# Patient Record
Sex: Male | Born: 1990 | Race: White | Hispanic: No | Marital: Single | Smoking: Current every day smoker
Health system: Southern US, Community
[De-identification: ages and names within clinical notes are randomized; demographics above are authoritative.]

## PROBLEM LIST (undated history)

## (undated) DIAGNOSIS — K5792 Diverticulitis of intestine, part unspecified, without perforation or abscess without bleeding: Secondary | ICD-10-CM

## (undated) DIAGNOSIS — F319 Bipolar disorder, unspecified: Secondary | ICD-10-CM

## (undated) DIAGNOSIS — F329 Major depressive disorder, single episode, unspecified: Secondary | ICD-10-CM

## (undated) DIAGNOSIS — F32A Depression, unspecified: Secondary | ICD-10-CM

---
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
## 2010-04-09 LAB — COMPREHENSIVE METABOLIC PANEL
ALT: 53 U/L (ref 0–53)
AST: 31 U/L (ref 0–37)
Albumin: 4.4 g/dL (ref 3.5–5.2)
Alkaline Phosphatase: 96 U/L (ref 39–117)
BUN: 8 mg/dL (ref 6–23)
CO2: 28 mEq/L (ref 19–32)
Calcium: 9.3 mg/dL (ref 8.4–10.5)
Chloride: 104 mEq/L (ref 96–112)
Creatinine, Ser: 1.09 mg/dL (ref 0.4–1.5)
GFR calc Af Amer: >60 mL/min (ref >60)
GFR calc non Af Amer: >60 mL/min (ref >60)
Glucose, Bld: 93 mg/dL (ref 70–99)
Potassium: 3.4 mEq/L — ABNORMAL LOW (ref 3.5–5.1)
Sodium: 138 mEq/L (ref 135–145)
Total Bilirubin: 0.5 mg/dL (ref 0.3–1.2)
Total Protein: 7.8 g/dL (ref 6.0–8.3)

## 2010-04-09 LAB — CBC
HCT: 45.3 % (ref 39.0–52.0)
Hemoglobin: 15.7 g/dL (ref 13.0–17.0)
MCHC: 34.7 g/dL (ref 30.0–36.0)
MCV: 84.7 fL (ref 78.0–100.0)
Platelets: 290 10*3/uL (ref 150–400)
RBC: 5.35 MIL/uL (ref 4.22–5.81)
RDW: 12.9 % (ref 11.5–15.5)
WBC: 11.8 10*3/uL — ABNORMAL HIGH (ref 4.0–10.5)

## 2010-04-09 LAB — DIFFERENTIAL
Eosinophils Absolute: 0.1 10*3/uL (ref 0.0–0.7)
Lymphocytes Relative: 24 % (ref 12–46)
Lymphs Abs: 2.8 10*3/uL (ref 0.7–4.0)
Monocytes Relative: 6 % (ref 3–12)
Neutrophils Relative %: 69 % (ref 43–77)

## 2010-04-09 LAB — URINALYSIS, ROUTINE W REFLEX MICROSCOPIC
Bilirubin Urine: NEGATIVE
Hgb urine dipstick: NEGATIVE
Ketones, ur: NEGATIVE mg/dL
Nitrite: NEGATIVE
Protein, ur: NEGATIVE mg/dL
Urobilinogen, UA: 0.2 mg/dL (ref 0.0–1.0)

## 2010-04-09 LAB — RAPID URINE DRUG SCREEN, HOSP PERFORMED
Amphetamines: POSITIVE — AB
Barbiturates: NOT DETECTED
Benzodiazepines: NOT DETECTED
Cocaine: NOT DETECTED
Opiates: NOT DETECTED
Tetrahydrocannabinol: NOT DETECTED

## 2010-04-09 LAB — ETHANOL: Alcohol, Ethyl (B): <5 mg/dL (ref 0–10)
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
## 2011-05-08 ENCOUNTER — Encounter (HOSPITAL_COMMUNITY): Payer: Self-pay | Admitting: *Deleted

## 2011-05-08 ENCOUNTER — Emergency Department (HOSPITAL_COMMUNITY)
Admission: EM | Admit: 2011-05-08 | Discharge: 2011-05-08 | Disposition: A | Discharge status: Home / Self Care | Payer: Medicaid Other | Attending: Emergency Medicine | Admitting: Emergency Medicine

## 2011-05-08 DIAGNOSIS — R059 Cough, unspecified: Secondary | ICD-10-CM | POA: Insufficient documentation

## 2011-05-08 DIAGNOSIS — R05 Cough: Secondary | ICD-10-CM | POA: Insufficient documentation

## 2011-05-08 DIAGNOSIS — R5381 Other malaise: Secondary | ICD-10-CM | POA: Insufficient documentation

## 2011-05-08 DIAGNOSIS — R110 Nausea: Secondary | ICD-10-CM | POA: Insufficient documentation

## 2011-05-08 DIAGNOSIS — J029 Acute pharyngitis, unspecified: Secondary | ICD-10-CM

## 2011-05-08 DIAGNOSIS — F319 Bipolar disorder, unspecified: Secondary | ICD-10-CM | POA: Insufficient documentation

## 2011-05-08 DIAGNOSIS — F17200 Nicotine dependence, unspecified, uncomplicated: Secondary | ICD-10-CM | POA: Insufficient documentation

## 2011-05-08 HISTORY — DX: Major depressive disorder, single episode, unspecified: F32.9

## 2011-05-08 HISTORY — DX: Depression, unspecified: F32.A

## 2011-05-08 HISTORY — DX: Bipolar disorder, unspecified: F31.9

## 2011-05-08 MED ORDER — ONDANSETRON 4 MG PO TBDP
4.0000 mg | ORAL_TABLET | Freq: Once | ORAL | Status: AC
  Administered 2011-05-08: 4 mg via ORAL
  Filled 2011-05-08: qty 1

## 2011-05-08 MED ORDER — IBUPROFEN 200 MG PO TABS
400.0000 mg | ORAL_TABLET | Freq: Once | ORAL | Status: AC
  Administered 2011-05-08: 400 mg via ORAL
  Filled 2011-05-08: qty 1

## 2011-05-08 MED ORDER — DEXAMETHASONE SODIUM PHOSPHATE 10 MG/ML IJ SOLN
10.0000 mg | Freq: Once | INTRAMUSCULAR | Status: DC
  Filled 2011-05-08: qty 1

## 2011-05-08 MED ORDER — PROMETHAZINE HCL 25 MG PO TABS: 25.0000 mg | ORAL_TABLET | Freq: Three times a day (TID) | ORAL | Status: DC | PRN
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
## 2011-05-08 NOTE — ED Provider Notes (Signed)
History    20yM with sore throat. Intermittent for past couple months. No fever or chills. Coughing. Sometimes productive. Smoker. Feels very fatigued. Has lost almost 50 lbs in the past year but took up skateboarding during this time and is generally much more active. No difficulty breathing. No vomiting. No urinary complaints. Denies PMHx aside from depression.   CSN: [id number]  Arrival date & time 05/08/11  1135   First MD Initiated Contact with Patient 05/08/11 1319      Chief Complaint  Patient presents with  . Sore Throat    (Consider location/radiation/quality/duration/timing/severity/associated sxs/prior treatment) HPI  Past Medical History  Diagnosis Date  . Depression   . Bipolar disorder     History reviewed. No pertinent past surgical history.  No family history on file.  History  Substance Use Topics  . Smoking status: Current Everyday Smoker -- 1.0 packs/day  . Smokeless tobacco: Not on file  . Alcohol Use: Yes     ocassionally      Review of Systems   Review of symptoms negative unless otherwise noted in HPI.   Allergies  Review of patient's allergies indicates no known allergies.  Home Medications   Current Outpatient Rx  Name Route Sig Dispense Refill  . PROMETHAZINE HCL 25 MG PO TABS Oral Take 1 tablet (25 mg total) by mouth every 8 (eight) hours as needed for nausea. 30 tablet 0    BP 125/78  Pulse 47  Temp(Src) 97.9 F (36.6 C) (Oral)  Resp 12  Wt 202 lb 8 oz (91.853 kg)  SpO2 99%  Physical Exam  Nursing note and vitals reviewed. Constitutional: He appears well-developed and well-nourished. No distress.  HENT:  Head: Normocephalic and atraumatic.  Right Ear: External ear normal.  Left Ear: External ear normal.  Mouth/Throat: Oropharynx is clear and moist.       Pharyngitis without exudate. Uvula is midline. Patient is handling secretions. No tongue elevation. Submental tissues are soft. There is no cervical adenopathy. Neck is  supple.  Eyes: Conjunctivae are normal. Right eye exhibits no discharge. Left eye exhibits no discharge.  Neck: Neck supple.  Cardiovascular: Normal rate, regular rhythm and normal heart sounds.  Exam reveals no gallop and no friction rub.   No murmur heard. Pulmonary/Chest: Effort normal and breath sounds normal. No respiratory distress.  Abdominal: Soft. He exhibits no distension. There is no tenderness.  Musculoskeletal: He exhibits no edema and no tenderness.  Neurological: He is alert.  Skin: Skin is warm and dry.  Psychiatric: He has a normal mood and affect. His behavior is normal. Thought content normal.    ED Course  Procedures (including critical care time)  Labs Reviewed - No data to display No results found.   1. Pharyngitis   2. Nausea       MDM  20yM with multiple vague and somewhat chronic complaints. Very low suspicion for emergent etiology. Abdominal exam benign. Possible viral pharyngitis. No evidence of deep space infection. Dose decadron for symptomatic relief. Phenergan for nausea. Benign abdominal exam. Recent weight loss but explainable with increased activity. Doubt malignancy. Pt needs to establish primary care. Resources provided.        Raeford Razor, MD 05/08/11 [id number]

## 2011-05-08 NOTE — Discharge Instructions (Signed)
Antibiotic Nonuse  Your caregiver felt that the infection or problem was not one that would be helped with an antibiotic. Infections may be caused by viruses or bacteria. Only a caregiver can tell which one of these is the likely cause of an illness. A cold is the most common cause of infection in both adults and children. A cold is a virus. Antibiotic treatment will have no effect on a viral infection. Viruses can lead to many lost days of work caring for sick children and many missed days of school. Children may catch as many as 10 "colds" or "flus" per year during which they can be tearful, cranky, and uncomfortable. The goal of treating a virus is aimed at keeping the ill person comfortable. Antibiotics are medications used to help the body fight bacterial infections. There are relatively few types of bacteria that cause infections but there are hundreds of viruses. While both viruses and bacteria cause infection they are very different types of germs. A viral infection will typically go away by itself within 7 to 10 days. Bacterial infections may spread or get worse without antibiotic treatment. Examples of bacterial infections are:  Sore throats (like strep throat or tonsillitis).   Infection in the lung (pneumonia).   Ear and skin infections.  Examples of viral infections are:  Colds or flus.   Most coughs and bronchitis.   Sore throats not caused by Strep.   Runny noses.  It is often best not to take an antibiotic when a viral infection is the cause of the problem. Antibiotics can kill off the helpful bacteria that we have inside our body and allow harmful bacteria to start growing. Antibiotics can cause side effects such as allergies, nausea, and diarrhea without helping to improve the symptoms of the viral infection. Additionally, repeated uses of antibiotics can cause bacteria inside of our body to become resistant. That resistance can be passed onto harmful bacterial. The next time  you have an infection it may be harder to treat if antibiotics are used when they are not needed. Not treating with antibiotics allows our own immune system to develop and take care of infections more efficiently. Also, antibiotics will work better for [place] when they are prescribed for bacterial infections. Treatments for a child that is ill may include:  Give extra fluids throughout the day to stay hydrated.   Get plenty of rest.   Only give your child over-the-counter or prescription medicines for pain, discomfort, or fever as directed by your caregiver.   The use of a cool mist humidifier may help stuffy noses.   Cold medications if suggested by your caregiver.  Your caregiver may decide to start you on an antibiotic if:  The problem you were seen for today continues for a longer length of time than expected.   You develop a secondary bacterial infection.  SEEK MEDICAL CARE IF:  Fever lasts longer than 5 days.   Symptoms continue to get worse after 5 to 7 days or become severe.   Difficulty in breathing develops.   Signs of dehydration develop (poor drinking, rare urinating, dark colored urine).   Changes in behavior or worsening tiredness (listlessness or lethargy).  Document Released: 03/19/2001 Document Revised: 12/28/2010 Document Reviewed: 09/15/2008 [facility] Patient Information 2012 [place], [place].Nausea, Adult Nausea is the feeling that you have an upset stomach or have to vomit. Nausea by itself is not likely a serious concern, but it may be an early sign of more serious medical problems. As  nausea gets worse, it can lead to vomiting. If vomiting develops, there is the risk of dehydration.  CAUSES   Viral infections.   Food poisoning.   Medicines.   Pregnancy.   Motion sickness.   Migraine headaches.   Emotional distress.   Severe pain from any source.   Alcohol intoxication.  HOME CARE INSTRUCTIONS  Get plenty of rest.   Ask your caregiver about  specific rehydration instructions.   Eat small amounts of food and sip liquids more often.   Take all medicines as told by your caregiver.  SEEK MEDICAL CARE IF:  You have not improved after 2 days, or you get worse.   You have a headache.  SEEK IMMEDIATE MEDICAL CARE IF:   You have a fever.   You faint.   You keep vomiting or have blood in your vomit.   You are extremely weak or dehydrated.   You have dark or bloody stools.   You have severe chest or abdominal pain.  MAKE SURE YOU:  Understand these instructions.   Will watch your condition.   Will get help right away if you are not doing well or get worse.  Document Released: 02/16/2004 Document Revised: 12/28/2010 Document Reviewed: 09/20/2010 [facility] Patient Information 2012 [place], [place].Viral Pharyngitis Viral pharyngitis is a viral infection that produces redness, pain, and swelling (inflammation) of the throat. It can spread from person to person (contagious). CAUSES Viral pharyngitis is caused by inhaling a large amount of certain germs called viruses. Many different viruses cause viral pharyngitis. SYMPTOMS Symptoms of viral pharyngitis include:  Sore throat.   Tiredness.   Stuffy nose.   Low-grade fever.   Congestion.   Cough.  TREATMENT Treatment includes rest, drinking plenty of fluids, and the use of over-the-counter medication (approved by your caregiver). HOME CARE INSTRUCTIONS   Drink enough fluids to keep your urine clear or pale yellow.   Eat soft, cold foods such as ice cream, frozen ice pops, or gelatin dessert.   Gargle with warm salt water (1 tsp salt per 1 qt of water).   If over age 29, throat lozenges may be used safely.   Only take over-the-counter or prescription medicines for pain, discomfort, or fever as directed by your caregiver. Do not take aspirin.  To help prevent spreading viral pharyngitis to others, avoid:  Mouth-to-mouth contact with others.   Sharing utensils  for eating and drinking.   Coughing around others.  SEEK MEDICAL CARE IF:   You are better in a few days, then become worse.   You have a fever or pain not helped by pain medicines.   There are any other changes that concern you.  Document Released: 10/18/2004 Document Revised: 12/28/2010 Document Reviewed: 03/16/2010 [facility] Patient Information 2012 [place], [place].  RESOURCE GUIDE  Dental Problems  Patients with Medicaid: [facility] [phone]                                           [phone] [address] [employer] Phone:  [phone]  Phone:  [phone]  If unable to pay or uninsured, contact:  [facility] or [facility]. to become qualified for the adult dental clinic.  Chronic Pain Problems Contact [facility]  [phone] Patients need to be referred by their primary care doctor.  Insufficient Money for Medicine Contact [employer]:  call "211" or [facility] [phone].  No Primary Care Doctor Call [facility]  [phone] Other agencies that provide inexpensive medical care    [facility]  [phone]    [facility]  [phone]    [facility]  [phone]    [facility]  [phone]    [facility]  [phone]    [facility]  [phone]  Psychological Services [facility]  [phone] [facility]  [phone] [facility]   [phone] (emergency services [phone])  Substance Abuse Resources [facility]  [phone] [facility] [phone] [facility] [place] [phone] Floydene Flock [phone] Residential & Outpatient Substance Abuse Program  [phone]  Abuse/Neglect [facility] Child Abuse Hotline [phone] [facility] Child Abuse Hotline [phone] (After Hours)  Emergency  Shelter [facility] [phone]  [facility] [place] [facility] [phone] [facility] [phone]  MRSA Hotline #:   (971)459-2599    [facility] Resources  [facility]     [employer]                          [facility] Dept. [address] [place]                       [address]      [address]  Blondell Reveal Phone:  [phone]                                   Phone:  [phone]                 Phone:  [phone]  [facility] Phone:  [phone]  [facility] Child Abuse Hotline [phone] [phone] (After Hours)

## 2013-06-11 ENCOUNTER — Emergency Department (HOSPITAL_COMMUNITY): Payer: Medicaid Other

## 2013-06-11 ENCOUNTER — Encounter (HOSPITAL_COMMUNITY): Payer: Self-pay | Admitting: Emergency Medicine

## 2013-06-11 ENCOUNTER — Emergency Department (HOSPITAL_COMMUNITY)
Admission: EM | Admit: 2013-06-11 | Discharge: 2013-06-11 | Disposition: A | Discharge status: Home / Self Care | Payer: Medicaid Other | Attending: Emergency Medicine | Admitting: Emergency Medicine

## 2013-06-11 DIAGNOSIS — Z8659 Personal history of other mental and behavioral disorders: Secondary | ICD-10-CM | POA: Insufficient documentation

## 2013-06-11 DIAGNOSIS — N201 Calculus of ureter: Secondary | ICD-10-CM | POA: Insufficient documentation

## 2013-06-11 DIAGNOSIS — F17200 Nicotine dependence, unspecified, uncomplicated: Secondary | ICD-10-CM | POA: Insufficient documentation

## 2013-06-11 DIAGNOSIS — N23 Unspecified renal colic: Secondary | ICD-10-CM

## 2013-06-11 LAB — COMPREHENSIVE METABOLIC PANEL
ALK PHOS: 72 U/L (ref 39–117)
ALT: 17 U/L (ref 0–53)
AST: 21 U/L (ref 0–37)
Albumin: 4.4 g/dL (ref 3.5–5.2)
BILIRUBIN TOTAL: 0.3 mg/dL (ref 0.3–1.2)
BUN: 14 mg/dL (ref 6–23)
CHLORIDE: 101 meq/L (ref 96–112)
CO2: 25 meq/L (ref 19–32)
CREATININE: 1.01 mg/dL (ref 0.50–1.35)
Calcium: 9.3 mg/dL (ref 8.4–10.5)
GFR calc Af Amer: >90 mL/min (ref >90)
Glucose, Bld: 119 mg/dL — ABNORMAL HIGH (ref 70–99)
Potassium: 3.4 mEq/L — ABNORMAL LOW (ref 3.7–5.3)
Sodium: 141 mEq/L (ref 137–147)
Total Protein: 7.6 g/dL (ref 6.0–8.3)

## 2013-06-11 LAB — CBC WITH DIFFERENTIAL/PLATELET
BASOS ABS: 0 10*3/uL (ref 0.0–0.1)
Basophils Relative: 0 % (ref 0–1)
Eosinophils Absolute: 0.1 10*3/uL (ref 0.0–0.7)
Eosinophils Relative: 1 % (ref 0–5)
HEMATOCRIT: 41.4 % (ref 39.0–52.0)
HEMOGLOBIN: 14.8 g/dL (ref 13.0–17.0)
LYMPHS PCT: 19 % (ref 12–46)
Lymphs Abs: 3.2 10*3/uL (ref 0.7–4.0)
MCH: 28.7 pg (ref 26.0–34.0)
MCHC: 35.7 g/dL (ref 30.0–36.0)
MCV: 80.4 fL (ref 78.0–100.0)
MONO ABS: 1.3 10*3/uL — AB (ref 0.1–1.0)
MONOS PCT: 8 % (ref 3–12)
NEUTROS ABS: 11.9 10*3/uL — AB (ref 1.7–7.7)
Neutrophils Relative %: 72 % (ref 43–77)
Platelets: 279 10*3/uL (ref 150–400)
RBC: 5.15 MIL/uL (ref 4.22–5.81)
RDW: 12.3 % (ref 11.5–15.5)
WBC: 16.5 10*3/uL — AB (ref 4.0–10.5)

## 2013-06-11 LAB — URINALYSIS, ROUTINE W REFLEX MICROSCOPIC
BILIRUBIN URINE: NEGATIVE
GLUCOSE, UA: NEGATIVE mg/dL
KETONES UR: NEGATIVE mg/dL
LEUKOCYTES UA: NEGATIVE
Nitrite: NEGATIVE
PROTEIN: NEGATIVE mg/dL
Specific Gravity, Urine: 1.021 (ref 1.005–1.030)
Urobilinogen, UA: 0.2 mg/dL (ref 0.0–1.0)
pH: 7 (ref 5.0–8.0)

## 2013-06-11 LAB — URINE MICROSCOPIC-ADD ON

## 2013-06-11 LAB — LIPASE, BLOOD: Lipase: 35 U/L (ref 11–59)

## 2013-06-11 MED ORDER — ONDANSETRON 8 MG PO TBDP: 8.0000 mg | ORAL_TABLET | Freq: Three times a day (TID) | ORAL | Status: DC | PRN

## 2013-06-11 MED ORDER — IOHEXOL 300 MG/ML  SOLN
100.0000 mL | Freq: Once | INTRAMUSCULAR | Status: AC | PRN
  Administered 2013-06-11: 100 mL via INTRAVENOUS

## 2013-06-11 MED ORDER — IOHEXOL 300 MG/ML  SOLN
50.0000 mL | Freq: Once | INTRAMUSCULAR | Status: AC | PRN
  Administered 2013-06-11: 50 mL via ORAL

## 2013-06-11 MED ORDER — HYDROMORPHONE HCL PF 1 MG/ML IJ SOLN
1.0000 mg | Freq: Once | INTRAMUSCULAR | Status: AC
  Administered 2013-06-11: 1 mg via INTRAVENOUS
  Filled 2013-06-11: qty 1

## 2013-06-11 MED ORDER — CEPHALEXIN 500 MG PO CAPS: 500.0000 mg | ORAL_CAPSULE | Freq: Four times a day (QID) | ORAL | Status: AC

## 2013-06-11 MED ORDER — SODIUM CHLORIDE 0.9 % IV BOLUS (SEPSIS)
1000.0000 mL | Freq: Once | INTRAVENOUS | Status: AC
  Administered 2013-06-11: 1000 mL via INTRAVENOUS

## 2013-06-11 MED ORDER — KETOROLAC TROMETHAMINE 30 MG/ML IJ SOLN
30.0000 mg | Freq: Once | INTRAMUSCULAR | Status: AC
  Administered 2013-06-11: 30 mg via INTRAVENOUS
  Filled 2013-06-11: qty 1

## 2013-06-11 MED ORDER — OXYCODONE-ACETAMINOPHEN 5-325 MG PO TABS: 1.0000 | ORAL_TABLET | ORAL | Status: DC | PRN

## 2013-06-11 MED ORDER — ONDANSETRON HCL 4 MG/2ML IJ SOLN
4.0000 mg | Freq: Once | INTRAMUSCULAR | Status: AC
  Administered 2013-06-11: 4 mg via INTRAVENOUS
  Filled 2013-06-11: qty 2

## 2013-06-11 NOTE — ED Notes (Signed)
Patient transported to CT 

## 2013-06-11 NOTE — ED Notes (Signed)
Pt reports bilateral lower abd pain that woke pt from sleep at 7am. Pt states he threw up multiple times this am, no diarrhea.

## 2013-06-11 NOTE — ED Provider Notes (Signed)
CSN: [id number]     Arrival date & time 06/11/13  1018 History   First MD Initiated Contact with Patient 06/11/13 1024     Chief Complaint  Patient presents with  . Abdominal Pain  . Emesis     (Consider location/radiation/quality/duration/timing/severity/associated sxs/prior Treatment) Patient is a 23 y.o. male presenting with abdominal pain and vomiting. The history is provided by the patient.  Abdominal Pain Pain location:  Generalized Pain quality: aching   Pain severity:  Severe Onset quality:  Unable to specify Timing:  Constant Chronicity:  New Context: awakening from sleep   Context: not alcohol use, not diet changes, not eating, not laxative use, not medication withdrawal, not previous surgeries, not recent illness, not recent sexual activity, not recent travel, not retching, not sick contacts, not suspicious food intake and not trauma   Relieved by:  Nothing Worsened by:  Nothing tried Ineffective treatments:  None tried Associated symptoms: vomiting   Risk factors: no alcohol abuse and has not had multiple surgeries   Emesis Associated symptoms: abdominal pain     Past Medical History  Diagnosis Date  . Depression   . Bipolar disorder    History reviewed. No pertinent past surgical history. History reviewed. No pertinent family history. History  Substance Use Topics  . Smoking status: Current Every Day Smoker -- 1.00 packs/day  . Smokeless tobacco: Not on file  . Alcohol Use: Yes     Comment: ocassionally    Review of Systems  Gastrointestinal: Positive for vomiting and abdominal pain.  All other systems reviewed and are negative.     Allergies  Review of patient's allergies indicates no known allergies.  Home Medications   Prior to Admission medications   Medication Sig Start Date End Date Taking? Authorizing Provider  promethazine (PHENERGAN) 25 MG tablet Take 1 tablet (25 mg total) by mouth every 8 (eight) hours as needed for nausea. 05/08/11  05/15/11  Raeford RazorStephen Kohut, MD   BP 137/89  Pulse 59  Temp(Src) 97.8 F (36.6 C) (Oral)  Resp 22  SpO2 98% Physical Exam  Nursing note and vitals reviewed. Constitutional: He is oriented to person, place, and time. He appears well-developed and well-nourished.  HENT:  Head: Normocephalic and atraumatic.  Right Ear: External ear normal.  Left Ear: External ear normal.  Nose: Nose normal.  Mouth/Throat: Oropharynx is clear and moist.  Eyes: Conjunctivae and EOM are normal. Pupils are equal, round, and reactive to light.  Neck: Normal range of motion. Neck supple.  Cardiovascular: Normal rate, regular rhythm, normal heart sounds and intact distal pulses.   Pulmonary/Chest: Effort normal and breath sounds normal. No respiratory distress. He has no wheezes. He exhibits no tenderness.  Abdominal: Soft. Bowel sounds are normal. He exhibits no distension and no mass. There is tenderness. There is no guarding.    Musculoskeletal: Normal range of motion.  Neurological: He is alert and oriented to person, place, and time. He has normal reflexes. He exhibits normal muscle tone. Coordination normal.  Skin: Skin is warm and dry.  Psychiatric: He has a normal mood and affect. His behavior is normal. Judgment and thought content normal.    ED Course  Procedures (including critical care time) Labs Review Labs Reviewed  CBC WITH DIFFERENTIAL  COMPREHENSIVE METABOLIC PANEL  LIPASE, BLOOD  URINALYSIS, ROUTINE W REFLEX MICROSCOPIC    Imaging Review Ct Abdomen Pelvis W Contrast  06/11/2013   CLINICAL DATA:  Severe abdominal pain, diarrhea/ constipation for 1 day  EXAM: CT ABDOMEN  AND PELVIS WITH CONTRAST  TECHNIQUE: Multidetector CT imaging of the abdomen and pelvis was performed using the standard protocol following bolus administration of intravenous contrast.  CONTRAST:  50mL OMNIPAQUE IOHEXOL 300 MG/ML SOLN, 100mL OMNIPAQUE IOHEXOL 300 MG/ML SOLN  COMPARISON:  None.  FINDINGS: Lung bases are  unremarkable. Sagittal images of the spine are unremarkable.  Liver, spleen, pancreas and adrenals are unremarkable.  Abdominal aorta is unremarkable.  No calcified gallstones are noted within gallbladder.  The kidneys show symmetrical enhancement. There is mild right hydronephrosis and right hydroureter. Minimal enhancement of the right ureter wall. Mild urinary tract inflammation cannot be excluded. In axial image 78 there is 4.3 mm calcified obstructive calculus in distal right ureter about 1.3 cm from right UVJ.  No small bowel obstruction. No ascites or free air. No adenopathy. No pericecal inflammation. Normal appendix. The terminal ileum is unremarkable. The sigmoid colon and rectum are empty. Prostate gland and seminal vesicles are unremarkable. No calcified calculi are noted within urinary bladder.  IMPRESSION: 1. Mild right hydronephrosis and right hydroureter. Mild enhancement of the right ureteral wall. Mild inflammatory changes cannot be excluded. 2. There is 4.3 mm calcified obstructive calculus in distal right ureter about 1.3 cm from right UVJ. 3. Normal appendix. 4. No small bowel or colonic obstruction.   Electronically Signed   By: Natasha MeadLiviu  Pop M.D.   On: 06/11/2013 12:43     EKG Interpretation None      MDM   Final diagnoses:  Ureteral colic    1:01 PM Patient feels improved with some ongoing back pain.  Patient with stone seen in distal right ureter.  Plan pain medicine and follow up with urology- will give toradol iv prior to iv removal.     Hilario Quarryanielle S Tracey Hermance, MD 06/11/13 1309

## 2013-06-11 NOTE — Discharge Instructions (Signed)
Kidney Stones  Kidney stones (urolithiasis) are deposits that form inside your kidneys. The intense pain is caused by the stone moving through the urinary tract. When the stone moves, the ureter goes into spasm around the stone. The stone is usually passed in the urine.   CAUSES   · A disorder that makes certain neck glands produce too much parathyroid hormone (primary hyperparathyroidism).  · A buildup of uric acid crystals, similar to gout in your joints.  · Narrowing (stricture) of the ureter.  · A kidney obstruction present at birth (congenital obstruction).  · Previous surgery on the kidney or ureters.  · Numerous kidney infections.  SYMPTOMS   · Feeling sick to your stomach (nauseous).  · Throwing up (vomiting).  · Blood in the urine (hematuria).  · Pain that usually spreads (radiates) to the groin.  · Frequency or urgency of urination.  DIAGNOSIS   · Taking a history and physical exam.  · Blood or urine tests.  · CT scan.  · Occasionally, an examination of the inside of the urinary bladder (cystoscopy) is performed.  TREATMENT   · Observation.  · Increasing your fluid intake.  · Extracorporeal shock wave lithotripsy This is a noninvasive procedure that uses shock waves to break up kidney stones.  · Surgery may be needed if you have severe pain or persistent obstruction. There are various surgical procedures. Most of the procedures are performed with the use of small instruments. Only small incisions are needed to accommodate these instruments, so recovery time is minimized.  The size, location, and chemical composition are all important variables that will determine the proper choice of action for you. Talk to your health care provider to better understand your situation so that you will minimize the risk of injury to yourself and your kidney.   HOME CARE INSTRUCTIONS   · Drink enough water and fluids to keep your urine clear or pale yellow. This will help you to pass the stone or stone fragments.  · Strain  all urine through the provided strainer. Keep all particulate matter and stones for your health care provider to see. The stone causing the pain may be as small as a grain of salt. It is very important to use the strainer each and every time you pass your urine. The collection of your stone will allow your health care provider to analyze it and verify that a stone has actually passed. The stone analysis will often identify what you can do to reduce the incidence of recurrences.  · Only take over-the-counter or prescription medicines for pain, discomfort, or fever as directed by your health care provider.  · Make a follow-up appointment with your health care provider as directed.  · Get follow-up X-rays if required. The absence of pain does not always mean that the stone has passed. It may have only stopped moving. If the urine remains completely obstructed, it can cause loss of kidney function or even complete destruction of the kidney. It is your responsibility to make sure X-rays and follow-ups are completed. Ultrasounds of the kidney can show blockages and the status of the kidney. Ultrasounds are not associated with any radiation and can be performed easily in a matter of minutes.  SEEK MEDICAL CARE IF:  · You experience pain that is progressive and unresponsive to any pain medicine you have been prescribed.  SEEK IMMEDIATE MEDICAL CARE IF:   · Pain cannot be controlled with the prescribed medicine.  · You have a fever   or shaking chills.  · The severity or intensity of pain increases over 18 hours and is not relieved by pain medicine.  · You develop a new onset of abdominal pain.  · You feel faint or pass out.  · You are unable to urinate.  MAKE SURE YOU:   · Understand these instructions.  · Will watch your condition.  · Will get help right away if you are not doing well or get worse.  Document Released: 01/08/2005 Document Revised: 09/10/2012 Document Reviewed: 06/11/2012  [employer]® Patient Information ©2014  [employer].

## 2013-06-12 LAB — URINE CULTURE
CULTURE: NO GROWTH
Colony Count: NO GROWTH
SPECIAL REQUESTS: NORMAL

## 2014-04-05 ENCOUNTER — Emergency Department: Payer: Self-pay | Admitting: Emergency Medicine

## 2015-08-15 IMAGING — CT CT ABD-PELV W/ CM
1 of 2 series · 15 of 32 positions shown, 19 images · IV contrast (OMNIPAQUE 300)
Comparison: None.

CLINICAL DATA: Severe abdominal pain, diarrhea/ constipation for 1
day

EXAM:
CT ABDOMEN AND PELVIS WITH CONTRAST
TECHNIQUE: Multidetector CT imaging of the abdomen and pelvis was performed
using the standard protocol following bolus administration of
intravenous contrast.
CONTRAST:  50mL OMNIPAQUE IOHEXOL 300 MG/ML SOLN, 100mL OMNIPAQUE
IOHEXOL 300 MG/ML SOLN

[Series 2: abd/pel with · axial · 0.79mm/px · z∈[-490,-55]mm · 15 of 95 slices shown, 19 images]
[im 4/95  soft-tissue]
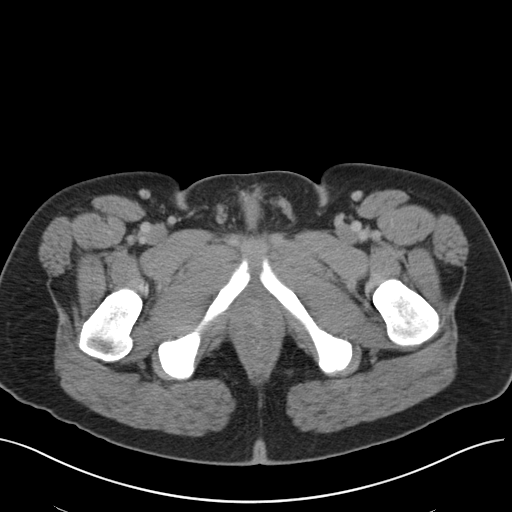
[im 4/95  bone]
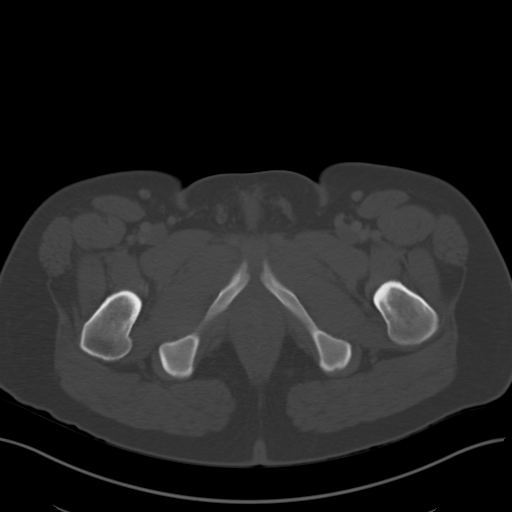
[im 11/95  soft-tissue]
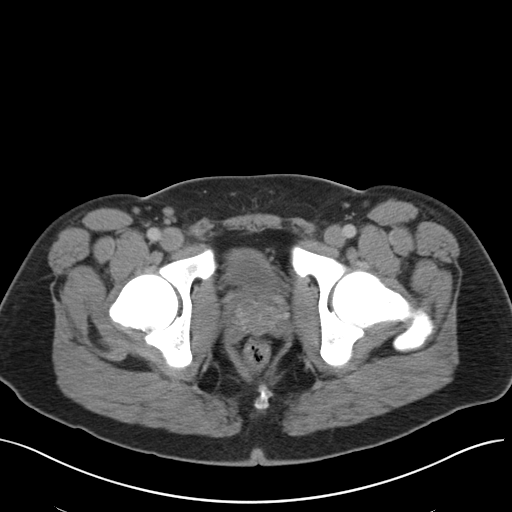
[im 19/95  soft-tissue]
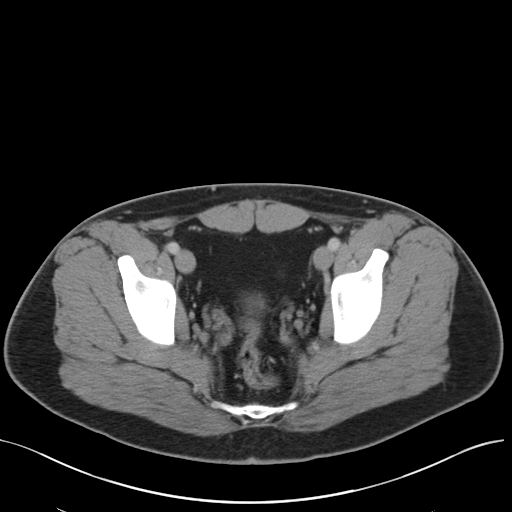
[im 26/95  soft-tissue]
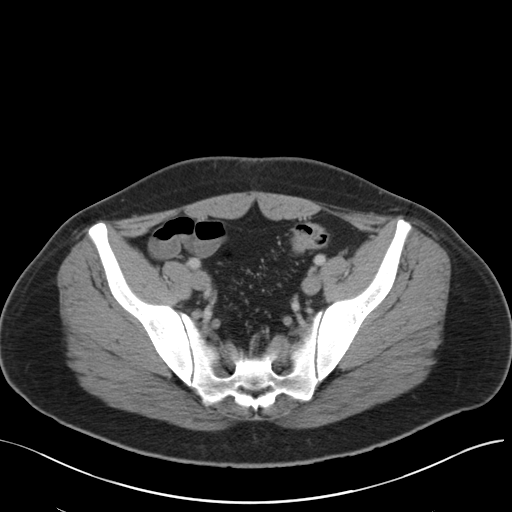
[im 33/95  soft-tissue]
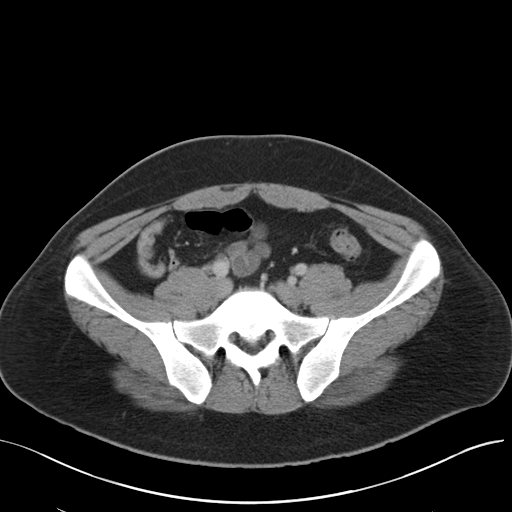
[im 40/95  soft-tissue]
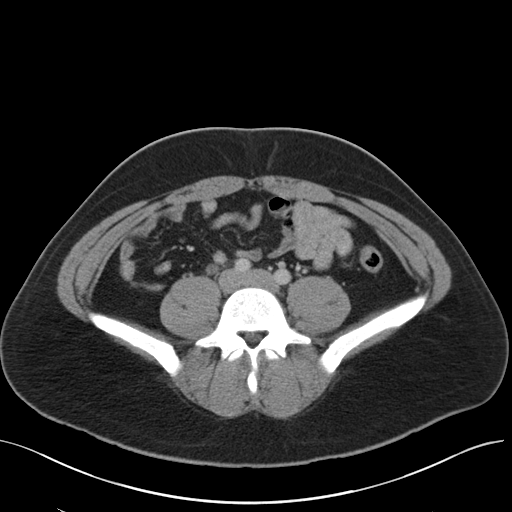
[im 48/95  soft-tissue]
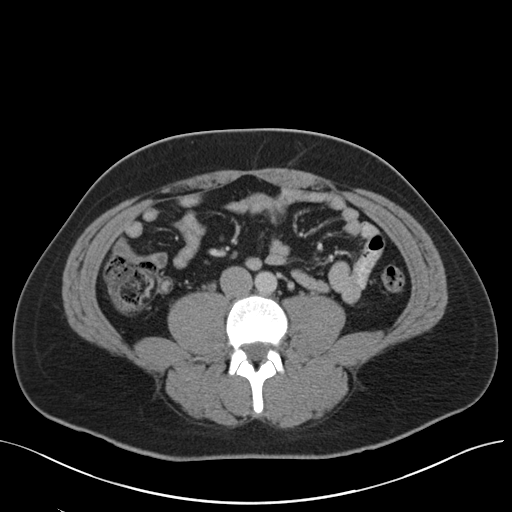
[im 55/95  soft-tissue]
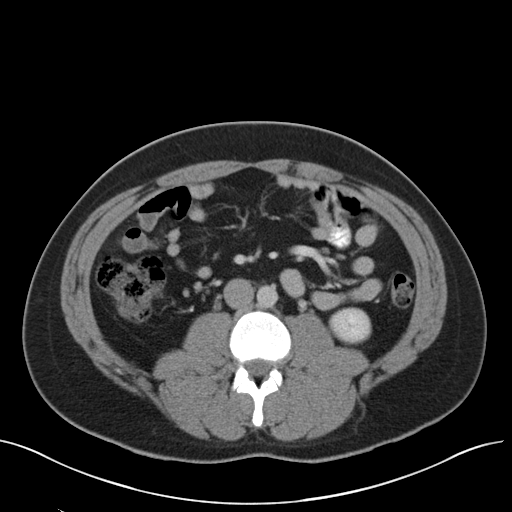
[im 62/95  soft-tissue]
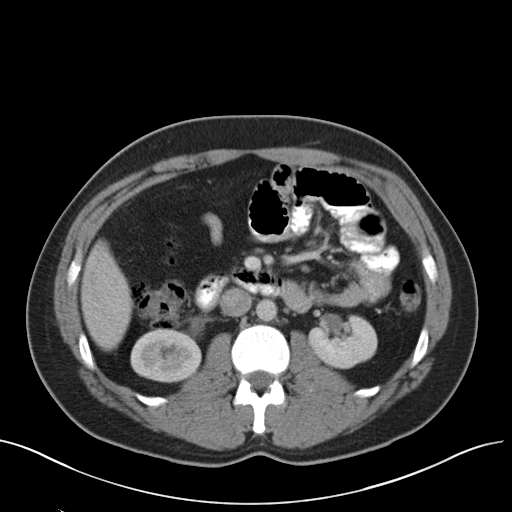
[im 62/95  bone]
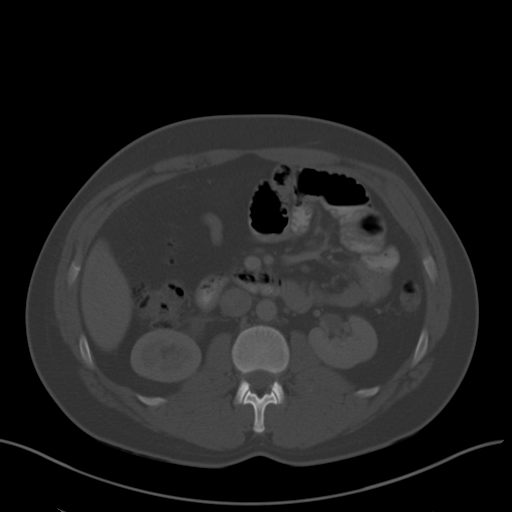
[im 69/95  soft-tissue]
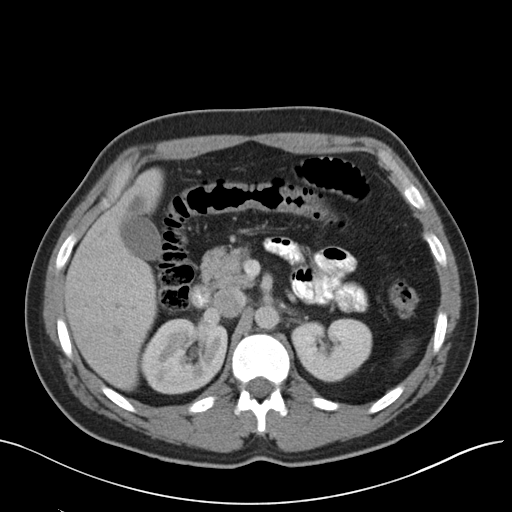
[im 76/95  soft-tissue]
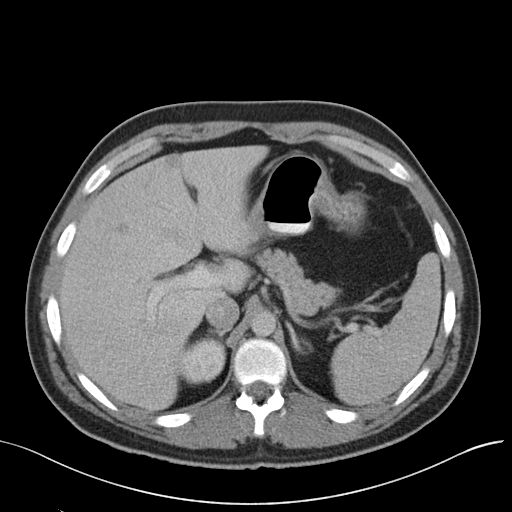
[im 80/95  lung]
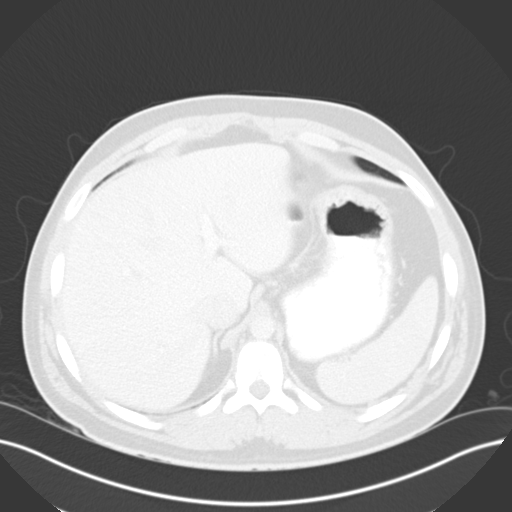
[im 84/95  soft-tissue]
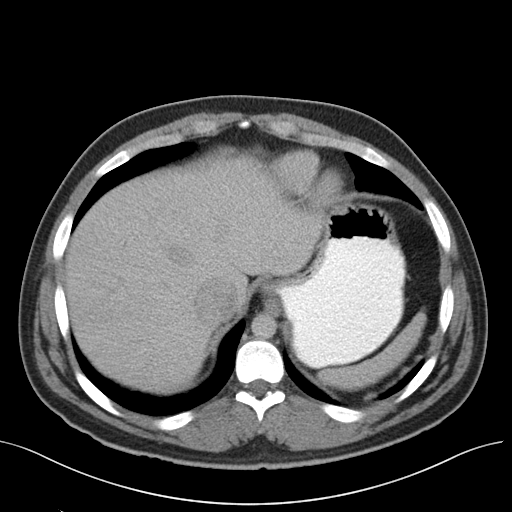
[im 84/95  lung]
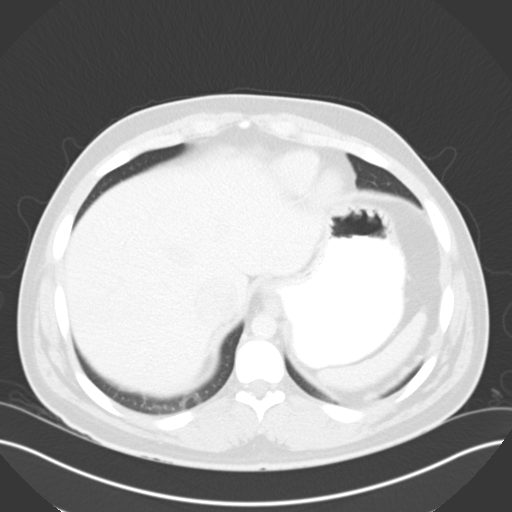
[im 87/95  lung]
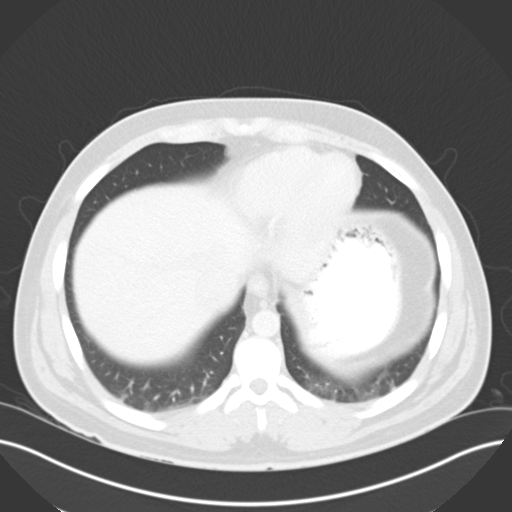
[im 91/95  soft-tissue]
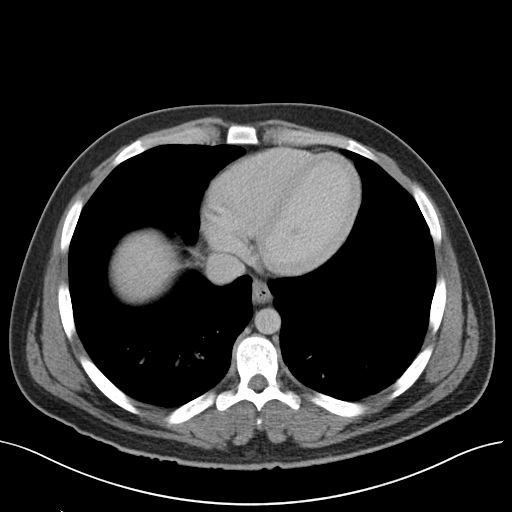
[im 91/95  lung]
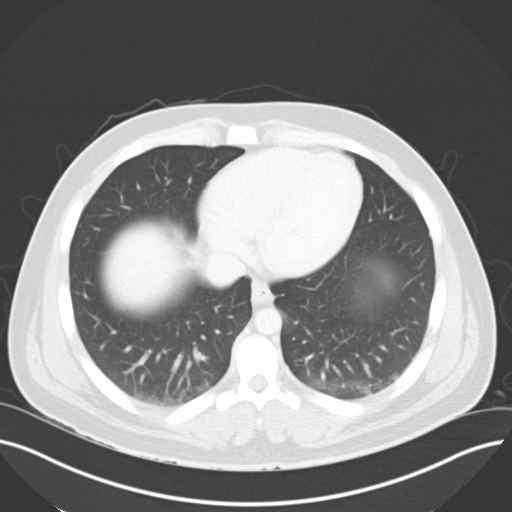

[15 of 32 positions shown; findings below may reference images not displayed]

FINDINGS: Lung bases are unremarkable. Sagittal images of the spine are
unremarkable.

Liver, spleen, pancreas and adrenals are unremarkable.

Abdominal aorta is unremarkable.

No calcified gallstones are noted within gallbladder.

The kidneys show symmetrical enhancement. There is mild right
hydronephrosis and right hydroureter. Minimal enhancement of the
right ureter wall. Mild urinary tract inflammation cannot be
excluded. In axial image 78 there is 4.3 mm calcified obstructive
calculus in distal right ureter about 1.3 cm from right UVJ.

No small bowel obstruction. No ascites or free air. No adenopathy.
No pericecal inflammation. Normal appendix. The terminal ileum is
unremarkable. The sigmoid colon and rectum are empty. Prostate gland
and seminal vesicles are unremarkable. No calcified calculi are
noted within urinary bladder.
IMPRESSION: 1. Mild right hydronephrosis and right hydroureter. Mild enhancement
of the right ureteral wall. Mild inflammatory changes cannot be
excluded.
2. There is 4.3 mm calcified obstructive calculus in distal right
ureter about 1.3 cm from right UVJ.
3. Normal appendix.
4. No small bowel or colonic obstruction.

## 2017-05-05 ENCOUNTER — Emergency Department (HOSPITAL_COMMUNITY): Payer: Self-pay

## 2017-05-05 ENCOUNTER — Encounter (HOSPITAL_COMMUNITY): Payer: Self-pay | Admitting: Emergency Medicine

## 2017-05-05 ENCOUNTER — Emergency Department (HOSPITAL_COMMUNITY)
Admission: EM | Admit: 2017-05-05 | Discharge: 2017-05-05 | Disposition: A | Discharge status: Home / Self Care | Payer: Self-pay | Attending: Emergency Medicine | Admitting: Emergency Medicine

## 2017-05-05 DIAGNOSIS — Y9389 Activity, other specified: Secondary | ICD-10-CM | POA: Insufficient documentation

## 2017-05-05 DIAGNOSIS — W230XXA Caught, crushed, jammed, or pinched between moving objects, initial encounter: Secondary | ICD-10-CM | POA: Insufficient documentation

## 2017-05-05 DIAGNOSIS — F319 Bipolar disorder, unspecified: Secondary | ICD-10-CM | POA: Insufficient documentation

## 2017-05-05 DIAGNOSIS — F17200 Nicotine dependence, unspecified, uncomplicated: Secondary | ICD-10-CM | POA: Insufficient documentation

## 2017-05-05 DIAGNOSIS — R52 Pain, unspecified: Secondary | ICD-10-CM

## 2017-05-05 DIAGNOSIS — Y929 Unspecified place or not applicable: Secondary | ICD-10-CM | POA: Insufficient documentation

## 2017-05-05 DIAGNOSIS — Z79899 Other long term (current) drug therapy: Secondary | ICD-10-CM | POA: Insufficient documentation

## 2017-05-05 DIAGNOSIS — S62244A Nondisplaced fracture of shaft of first metacarpal bone, right hand, initial encounter for closed fracture: Secondary | ICD-10-CM | POA: Insufficient documentation

## 2017-05-05 DIAGNOSIS — Y998 Other external cause status: Secondary | ICD-10-CM | POA: Insufficient documentation

## 2017-05-05 NOTE — Discharge Instructions (Signed)
Take ibuprofen 3 times a day with meals.  Do not take other anti-inflammatories at the same time open (Advil, Motrin, naproxen, Aleve). You may supplement with Tylenol if you need further pain control. Use ice packs to help with pain and swelling.

## 2017-05-05 NOTE — ED Notes (Signed)
PA at bedside.

## 2017-05-05 NOTE — ED Triage Notes (Signed)
Pt reports that right hand got shut in door two days ago. Bruising noted to right hand. Able to make fist, reports is feeling better but needs a work note.

## 2017-05-05 NOTE — ED Provider Notes (Signed)
[facility]-EMERGENCY DEPT Provider Note   CSN: [id number] Arrival date & time: 05/05/17  1111     History   Chief Complaint Chief Complaint  Patient presents with  . Hand Injury  . needs note for work    HPI Eric Blahnik is a 27 y.o. male presenting for evaluation of right thumb pain.  Pt states that on Thursday night, his right hand got caught in a door that was closing.  He reports acute onset of pain at the base of the right thumb.  Since then, his pain has been improving.  He has been using ice, keeping it wrapped with an Ace wrap, and taking Tylenol and ibuprofen.  He reports mobility and pain has improved today, but needs a work note as he was unable to go to work for the past 2 days.  He works at [employer] and shake in [employer], and is right-handed.  He denies pain elsewhere.  He denies numbness or tingling.  No pain at rest, has some residual pain with certain movements and palpation.  HPI  Past Medical History:  Diagnosis Date  . Bipolar disorder (HCC)   . Depression     There are no active problems to display for this patient.   History reviewed. No pertinent surgical history.      Home Medications    Prior to Admission medications   Medication Sig Start Date End Date Taking? Authorizing Provider  busPIRone (BUSPAR) 10 MG tablet Take 10 mg by mouth 2 (two) times daily.    [provider]  cephALEXin (KEFLEX) 500 MG capsule Take 1 capsule (500 mg total) by mouth 4 (four) times daily. 06/11/13   Margarita Grizzle, MD  Lurasidone HCl (LATUDA) 20 MG TABS Take 1 tablet by mouth daily.    [provider]  ondansetron (ZOFRAN ODT) 8 MG disintegrating tablet Take 1 tablet (8 mg total) by mouth every 8 (eight) hours as needed for nausea or vomiting. 06/11/13   Margarita Grizzle, MD  oxyCODONE-acetaminophen (PERCOCET/ROXICET) 5-325 MG per tablet Take 1 tablet by mouth every 4 (four) hours as needed for severe pain. 06/11/13   Margarita Grizzle,  MD  traZODone (DESYREL) 100 MG tablet Take 50-100 mg by mouth at bedtime.    [provider]    Family History No family history on file.  Social History Social History   Tobacco Use  . Smoking status: Current Every Day Smoker    Packs/day: 1.00  Substance Use Topics  . Alcohol use: Yes    Comment: ocassionally  . Drug use: Yes    Frequency: 12.0 times per week    Types: Marijuana     Allergies   Patient has no known allergies.   Review of Systems Review of Systems  Musculoskeletal: Positive for arthralgias and joint swelling.  Skin: Negative for wound.  Neurological: Negative for numbness.  Hematological: Does not bruise/bleed easily.     Physical Exam Updated Vital Signs BP 128/72 (BP Location: Left Arm)   Pulse (!) 41   Temp 97.9 F (36.6 C) (Oral)   Resp 14   Ht 5\' 11"  (1.803 m)   Wt 95.3 kg (210 lb)   SpO2 97%   BMI 29.29 kg/m   Physical Exam  Constitutional: He is oriented to person, place, and time. He appears well-developed and well-nourished. No distress.  HENT:  Head: Normocephalic and atraumatic.  Eyes: EOM are normal.  Neck: Normal range of motion.  Pulmonary/Chest: Effort normal.  Abdominal:  He exhibits no distension.  Musculoskeletal: Normal range of motion. He exhibits edema and tenderness.  Obvious swelling of the radial aspect of the right hand.  Bruising of the thenar eminence.  Full active range of motion of the wrist and all fingers without significant pain. Strength of all fingers against resistance intact.  Sensation intact.  Good cap refill.  Mild tenderness palpation of the palmar aspect at the base of the right thumb.  Radial pulses intact bilaterally.  No obvious injury noted elsewhere.  Neurological: He is alert and oriented to person, place, and time. No sensory deficit.  Skin: Skin is warm. No rash noted.  Psychiatric: He has a normal mood and affect.  Nursing note and vitals reviewed.    ED Treatments / Results    Labs (all labs ordered are listed, but only abnormal results are displayed) Labs Reviewed - No data to display  EKG None  Radiology Dg Hand Complete Right  Result Date: 05/05/2017 CLINICAL DATA:  Pain and swelling after crush injury. EXAM: RIGHT HAND - COMPLETE 3+ VIEW COMPARISON:  Right wrist x-rays dated October 21, 2003. FINDINGS: Linear lucency in the first metacarpal, consistent with nondisplaced fracture. Additional linear ossific fragment projecting over the distal carpal row on the lateral view. Bone mineralization is normal. Mild dorsal hand soft tissue swelling. No dislocation. IMPRESSION: 1. Nondisplaced fracture of the first metacarpal shaft. 2. Linear ossific fragment projecting over the distal carpal row on the lateral view likely represents a small avulsion fracture. Electronically Signed   By: Obie DredgeWilliam T Derry M.D.   On: 05/05/2017 11:46    Procedures Procedures (including critical care time)  Medications Ordered in ED Medications - No data to display   Initial Impression / Assessment and Plan / ED Course  I have reviewed the triage vital signs and the nursing notes.  Pertinent labs & imaging results that were available during my care of the patient were reviewed by me and considered in my medical decision making (see chart for details).     Patient presenting for evaluation of right thumb pain.  Physical exam reassuring, patient neurovascularly intact.  X-ray viewed and interpreted by me, shows nondisplaced fracture of the first metacarpal and possible avulsion fracture.  Discussed findings with patient.  Will place patient in a thumb spica and have him follow-up with hand.  Continue using Tylenol and ibuprofen for pain.  Work note given.   Post splint assessment shows good cap refill and sensation intact.  At this time, patient appears safe for discharge.  Return precautions given.  Patient states he understands and agrees to plan.   Final Clinical Impressions(s) /  ED Diagnoses   Final diagnoses:  Closed nondisplaced fracture of shaft of first metacarpal bone of right hand, initial encounter    ED Discharge Orders    None       Alveria ApleyCaccavale, Blandina Renaldo, PA-C 05/05/17 1526    Vanetta MuldersZackowski, Scott, MD 05/07/17 [phone]

## 2021-05-01 ENCOUNTER — Encounter (HOSPITAL_COMMUNITY): Payer: Self-pay | Admitting: Emergency Medicine

## 2021-05-01 ENCOUNTER — Emergency Department (HOSPITAL_COMMUNITY)
Admission: EM | Admit: 2021-05-01 | Discharge: 2021-05-01 | Disposition: A | Discharge status: Home / Self Care | Payer: Medicaid Other | Attending: Emergency Medicine | Admitting: Emergency Medicine

## 2021-05-01 DIAGNOSIS — U071 COVID-19: Secondary | ICD-10-CM | POA: Insufficient documentation

## 2021-05-01 DIAGNOSIS — R630 Anorexia: Secondary | ICD-10-CM | POA: Insufficient documentation

## 2021-05-01 LAB — CBC WITH DIFFERENTIAL/PLATELET
Abs Immature Granulocytes: 0.02 10*3/uL (ref 0.00–0.07)
Basophils Absolute: 0 10*3/uL (ref 0.0–0.1)
Basophils Relative: 0 %
Eosinophils Absolute: 0.1 10*3/uL (ref 0.0–0.5)
Eosinophils Relative: 2 %
HCT: 50.4 % (ref 39.0–52.0)
Hemoglobin: 17.2 g/dL — ABNORMAL HIGH (ref 13.0–17.0)
Immature Granulocytes: 0 %
Lymphocytes Relative: 30 %
Lymphs Abs: 1.8 10*3/uL (ref 0.7–4.0)
MCH: 29.4 pg (ref 26.0–34.0)
MCHC: 34.1 g/dL (ref 30.0–36.0)
MCV: 86 fL (ref 80.0–100.0)
Monocytes Absolute: 1.1 10*3/uL — ABNORMAL HIGH (ref 0.1–1.0)
Monocytes Relative: 19 %
Neutro Abs: 2.9 10*3/uL (ref 1.7–7.7)
Neutrophils Relative %: 49 %
Platelets: 201 10*3/uL (ref 150–400)
RBC: 5.86 MIL/uL — ABNORMAL HIGH (ref 4.22–5.81)
RDW: 12.6 % (ref 11.5–15.5)
WBC: 5.9 10*3/uL (ref 4.0–10.5)
nRBC: 0 % (ref 0.0–0.2)

## 2021-05-01 LAB — COMPREHENSIVE METABOLIC PANEL
ALT: 46 U/L — ABNORMAL HIGH (ref 0–44)
AST: 35 U/L (ref 15–41)
Albumin: 4.7 g/dL (ref 3.5–5.0)
Alkaline Phosphatase: 62 U/L (ref 38–126)
Anion gap: 8 (ref 5–15)
BUN: 16 mg/dL (ref 6–20)
CO2: 28 mmol/L (ref 22–32)
Calcium: 9.1 mg/dL (ref 8.9–10.3)
Chloride: 101 mmol/L (ref 98–111)
Creatinine, Ser: 1.14 mg/dL (ref 0.61–1.24)
GFR, Estimated: >60 mL/min (ref >60)
Glucose, Bld: 79 mg/dL (ref 70–99)
Potassium: 3.7 mmol/L (ref 3.5–5.1)
Sodium: 137 mmol/L (ref 135–145)
Total Bilirubin: 0.5 mg/dL (ref 0.3–1.2)
Total Protein: 8.3 g/dL — ABNORMAL HIGH (ref 6.5–8.1)

## 2021-05-01 LAB — LIPASE, BLOOD: Lipase: 29 U/L (ref 11–51)

## 2021-05-01 LAB — TYPE AND SCREEN
ABO/RH(D): A POS
Antibody Screen: NEGATIVE

## 2021-05-01 MED ORDER — ONDANSETRON 4 MG PO TBDP: 4.0000 mg | ORAL_TABLET | Freq: Three times a day (TID) | ORAL | 0 refills | Status: AC | PRN

## 2021-05-01 NOTE — Discharge Instructions (Signed)
Please follow-up with your primary care doctor.  Drink plenty of fluids as discussed.  If you develop any blood in your vomit, blood in your stool, dark black stools or other new concerning symptom, come back to ER for reassessment. ? ?Follow isolation precautions per CDC guidelines. ?

## 2021-05-01 NOTE — ED Provider Triage Note (Addendum)
Emergency Medicine Provider Triage Evaluation Note ? ?Eric Frye , a 31 y.o. male  was evaluated in triage.  Recently COVID +. Pt complains of vomiting since last night. He reports that he hasn't been able to eat anything, but had black/brown emesi 5-20 episodes all last night. Denies any abdominal pain, but did have some lower abdominal pain with the emesis. He reports some some diarrhea, but denies any melena or hematochezia. Reports fever a few days ago at 100.5. ? ?Review of Systems  ?Positive:  ?Negative:  ? ?Physical Exam  ?BP (!) 131/93 (BP Location: Left Arm)   Pulse 86   Temp 98 ?F (36.7 ?C) (Oral)   Resp 16   SpO2 95%  ?Gen:   Awake, no distress   ?Resp:  Normal effort  ?MSK:   Moves extremities without difficulty  ?Other:   ? ?Medical Decision Making  ?Medically screening exam initiated at 2:14 PM.  Appropriate orders placed.  Eric Frye was informed that the remainder of the evaluation will be completed by another provider, this initial triage assessment does not replace that evaluation, and the importance of remaining in the ED until their evaluation is complete. ? ?Will order basic labs. Deferred imaging till patient is further evaluated in the back.  ?  ?Achille Rich, PA-C ?05/01/21 1418 ? ?  ?Achille Rich, PA-C ?05/01/21 1418 ? ?

## 2021-05-01 NOTE — ED Triage Notes (Signed)
Per pt, states he was diagnosed with Covid 2 days ago-states he started vomiting last night-states this morning his emesis was dark and that concerned him so he came to ED ?

## 2021-05-01 NOTE — ED Provider Notes (Signed)
?[facility]-EMERGENCY DEPT ?Provider Note ? ? ?CSN: [id number] ?Arrival date & time: 05/01/21  1350 ? ?  ? ?History ? ?Chief Complaint  ?Patient presents with  ? Emesis  ? ? ?Eric Frye is a 31 y.o. male.  Presents to the emergency department with concern for vomiting.  Patient states that for the past 3 or 4 days he has been feeling generally unwell.  Has had decreased appetite, yesterday evening and last night he was having bad vomiting spells.  Felt like he was vomiting all night, at 1 point thought his vomit appeared dark.  No black or coffee-ground appearance however.  He denies any abdominal pain and had no dark stools.  No blood in stool.  He denies any major medical problems.  Does not smoke tobacco.  Tested positive for COVID-19 at home.  Roommate tested positive for COVID-19. ? ?Since being in the emergency department he has not had any additional episodes of vomiting. ? ?HPI ? ?  ? ?Home Medications ?Prior to Admission medications   ?Medication Sig Start Date End Date Taking? Authorizing Provider  ?busPIRone (BUSPAR) 10 MG tablet Take 10 mg by mouth 2 (two) times daily.    [provider]  ?cephALEXin (KEFLEX) 500 MG capsule Take 1 capsule (500 mg total) by mouth 4 (four) times daily. 06/11/13   Margarita Grizzle, MD  ?Lurasidone HCl (LATUDA) 20 MG TABS Take 1 tablet by mouth daily.    [provider]  ?ondansetron (ZOFRAN ODT) 8 MG disintegrating tablet Take 1 tablet (8 mg total) by mouth every 8 (eight) hours as needed for nausea or vomiting. 06/11/13   Margarita Grizzle, MD  ?oxyCODONE-acetaminophen (PERCOCET/ROXICET) 5-325 MG per tablet Take 1 tablet by mouth every 4 (four) hours as needed for severe pain. 06/11/13   Margarita Grizzle, MD  ?traZODone (DESYREL) 100 MG tablet Take 50-100 mg by mouth at bedtime.    [provider]  ?   ? ?Allergies    ?Patient has no known allergies.   ? ?Review of Systems   ?Review of Systems  ?Constitutional:  Positive for fatigue.  Negative for chills and fever.  ?HENT:  Negative for ear pain and sore throat.   ?Eyes:  Negative for pain and visual disturbance.  ?Respiratory:  Negative for cough and shortness of breath.   ?Cardiovascular:  Negative for chest pain and palpitations.  ?Gastrointestinal:  Positive for nausea and vomiting. Negative for abdominal pain.  ?Genitourinary:  Negative for dysuria and hematuria.  ?Musculoskeletal:  Negative for arthralgias and back pain.  ?Skin:  Negative for color change and rash.  ?Neurological:  Negative for seizures and syncope.  ?All other systems reviewed and are negative. ? ?Physical Exam ?Updated Vital Signs ?BP (!) 131/93 (BP Location: Left Arm)   Pulse 86   Temp 98 ?F (36.7 ?C) (Oral)   Resp 16   SpO2 95%  ?Physical Exam ?Vitals and nursing note reviewed.  ?Constitutional:   ?   General: He is not in acute distress. ?   Appearance: He is well-developed.  ?HENT:  ?   Head: Normocephalic and atraumatic.  ?Eyes:  ?   Conjunctiva/sclera: Conjunctivae normal.  ?Cardiovascular:  ?   Rate and Rhythm: Normal rate and regular rhythm.  ?   Heart sounds: No murmur heard. ?Pulmonary:  ?   Effort: Pulmonary effort is normal. No respiratory distress.  ?   Breath sounds: Normal breath sounds.  ?Abdominal:  ?   Palpations: Abdomen is soft.  ?  Tenderness: There is no abdominal tenderness.  ?Musculoskeletal:     ?   General: No swelling.  ?   Cervical back: Neck supple.  ?Skin: ?   General: Skin is warm and dry.  ?   Capillary Refill: Capillary refill takes less than 2 seconds.  ?Neurological:  ?   Mental Status: He is alert.  ?Psychiatric:     ?   Mood and Affect: Mood normal.  ? ? ?ED Results / Procedures / Treatments   ?Labs ?(all labs ordered are listed, but only abnormal results are displayed) ?Labs Reviewed  ?CBC WITH DIFFERENTIAL/PLATELET - Abnormal; Notable for the following components:  ?    Result Value  ? RBC 5.86 (*)   ? Hemoglobin 17.2 (*)   ? Monocytes Absolute 1.1 (*)   ? All other components  within normal limits  ?COMPREHENSIVE METABOLIC PANEL - Abnormal; Notable for the following components:  ? Total Protein 8.3 (*)   ? ALT 46 (*)   ? All other components within normal limits  ?LIPASE, BLOOD  ?URINALYSIS, ROUTINE W REFLEX MICROSCOPIC  ?TYPE AND SCREEN  ?ABO/RH  ? ? ?EKG ?None ? ?Radiology ?No results found. ? ?Procedures ?Procedures  ? ? ?Medications Ordered in ED ?Medications - No data to display ? ?ED Course/ Medical Decision Making/ A&P ?  ?                        ?Medical Decision Making ? ?31 year old otherwise healthy male presenting to ER with concern for nausea and vomiting in setting of recent COVID-positive test at home.  On exam he appears well in no distress, his mucous membranes are moist, his vitals are normal.  His abdomen is soft.  Labs are reviewed.  There is no anemia, no leukocytosis, no electrolyte derangement.  He endorsed concern for possibly dark vomit but denied any black or coffee-ground type appearance.  At no point had any dark stools, his hemoglobin is 17.  Has had no further vomiting this afternoon.  Doubt GI bleed.  Suspect his symptoms are related to the COVID.  I advised supportive care.  Offered to start IV and provide IV fluids and antiemetic however patient states that he was feeling better and wanted to go home.  Discussed pros and cons of Paxlovid -patient states that he is not interested in this medication.  Gave Rx for Zofran. ? ? ? ?After the discussed management above, the patient was determined to be safe for discharge.  The patient was in agreement with this plan and all questions regarding their care were answered.  ED return precautions were discussed and the patient will return to the ED with any significant worsening of condition. ? ? ? ? ? ? ? ? ?Final Clinical Impression(s) / ED Diagnoses ?Final diagnoses:  ?COVID-19  ? ? ?Rx / DC Orders ?ED Discharge Orders   ? ? None  ? ?  ? ? ?  ?Milagros Loll, MD ?05/01/21 1707 ? ?

## 2022-01-12 NOTE — ED Notes (Signed)
 After meds. Pt able to ambulate w/o issue

## 2022-03-08 ENCOUNTER — Ambulatory Visit: Payer: Worker's Compensation

## 2022-03-08 DIAGNOSIS — Z021 Encounter for pre-employment examination: Secondary | ICD-10-CM

## 2022-03-08 LAB — POCT URINE DRUG SCREEN
POC Amphetamine UR: NOT DETECTED
POC BENZODIAZEPINES UR: NOT DETECTED
POC Barbiturate UR: NOT DETECTED
POC Cocaine UR: NOT DETECTED
POC Ecstasy UR: NOT DETECTED
POC Marijuana UR: NOT DETECTED
POC Methadone UR: NOT DETECTED
POC Methamphetamine UR: NOT DETECTED
POC Opiate Ur: NOT DETECTED
POC Oxycodone UR: NOT DETECTED
POC PHENCYCLIDINE UR: NOT DETECTED
POC TRICYCLICS UR: NOT DETECTED
URINE TEMPERATURE: 98 Degrees F (ref 90.0–100.0)

## 2022-03-08 NOTE — Progress Notes (Signed)
[employer] pre-Employment Drug Screening Pt states no medication use of any controlled medications. Pt also states not taking any other medicines for medical issues.

## 2023-08-27 ENCOUNTER — Emergency Department (HOSPITAL_BASED_OUTPATIENT_CLINIC_OR_DEPARTMENT_OTHER): Payer: Self-pay

## 2023-08-27 ENCOUNTER — Encounter (HOSPITAL_BASED_OUTPATIENT_CLINIC_OR_DEPARTMENT_OTHER): Payer: Self-pay

## 2023-08-27 ENCOUNTER — Emergency Department (HOSPITAL_BASED_OUTPATIENT_CLINIC_OR_DEPARTMENT_OTHER)
Admission: EM | Admit: 2023-08-27 | Discharge: 2023-08-27 | Disposition: A | Discharge status: Home / Self Care | Payer: Self-pay | Attending: Emergency Medicine | Admitting: Emergency Medicine

## 2023-08-27 DIAGNOSIS — K5790 Diverticulosis of intestine, part unspecified, without perforation or abscess without bleeding: Secondary | ICD-10-CM

## 2023-08-27 DIAGNOSIS — R109 Unspecified abdominal pain: Secondary | ICD-10-CM

## 2023-08-27 DIAGNOSIS — F17210 Nicotine dependence, cigarettes, uncomplicated: Secondary | ICD-10-CM | POA: Insufficient documentation

## 2023-08-27 DIAGNOSIS — K5730 Diverticulosis of large intestine without perforation or abscess without bleeding: Secondary | ICD-10-CM | POA: Insufficient documentation

## 2023-08-27 HISTORY — DX: Diverticulitis of intestine, part unspecified, without perforation or abscess without bleeding: K57.92

## 2023-08-27 LAB — URINALYSIS, ROUTINE W REFLEX MICROSCOPIC
Bilirubin Urine: NEGATIVE
Glucose, UA: NEGATIVE mg/dL
Ketones, ur: NEGATIVE mg/dL
Leukocytes,Ua: NEGATIVE
Nitrite: NEGATIVE
Protein, ur: NEGATIVE mg/dL
Specific Gravity, Urine: 1.02 (ref 1.005–1.030)
pH: 5.5 (ref 5.0–8.0)

## 2023-08-27 LAB — URINALYSIS, MICROSCOPIC (REFLEX)

## 2023-08-27 LAB — CBC WITH DIFFERENTIAL/PLATELET
Abs Immature Granulocytes: 0.05 K/uL (ref 0.00–0.07)
Basophils Absolute: 0 K/uL (ref 0.0–0.1)
Basophils Relative: 0 %
Eosinophils Absolute: 0.1 K/uL (ref 0.0–0.5)
Eosinophils Relative: 1 %
HCT: 42.8 % (ref 39.0–52.0)
Hemoglobin: 14.9 g/dL (ref 13.0–17.0)
Immature Granulocytes: 1 %
Lymphocytes Relative: 24 %
Lymphs Abs: 2.4 K/uL (ref 0.7–4.0)
MCH: 28.7 pg (ref 26.0–34.0)
MCHC: 34.8 g/dL (ref 30.0–36.0)
MCV: 82.5 fL (ref 80.0–100.0)
Monocytes Absolute: 0.8 K/uL (ref 0.1–1.0)
Monocytes Relative: 8 %
Neutro Abs: 6.5 K/uL (ref 1.7–7.7)
Neutrophils Relative %: 66 %
Platelets: 288 K/uL (ref 150–400)
RBC: 5.19 MIL/uL (ref 4.22–5.81)
RDW: 12.7 % (ref 11.5–15.5)
WBC: 9.8 K/uL (ref 4.0–10.5)
nRBC: 0 % (ref 0.0–0.2)

## 2023-08-27 LAB — COMPREHENSIVE METABOLIC PANEL WITH GFR
ALT: 19 U/L (ref 0–44)
AST: 20 U/L (ref 15–41)
Albumin: 4.5 g/dL (ref 3.5–5.0)
Alkaline Phosphatase: 69 U/L (ref 38–126)
Anion gap: 10 (ref 5–15)
BUN: 9 mg/dL (ref 6–20)
CO2: 26 mmol/L (ref 22–32)
Calcium: 9 mg/dL (ref 8.9–10.3)
Chloride: 103 mmol/L (ref 98–111)
Creatinine, Ser: 1.07 mg/dL (ref 0.61–1.24)
GFR, Estimated: >60 mL/min (ref >60)
Glucose, Bld: 101 mg/dL — ABNORMAL HIGH (ref 70–99)
Potassium: 3.8 mmol/L (ref 3.5–5.1)
Sodium: 139 mmol/L (ref 135–145)
Total Bilirubin: 0.3 mg/dL (ref 0.0–1.2)
Total Protein: 7.1 g/dL (ref 6.5–8.1)

## 2023-08-27 LAB — LIPASE, BLOOD: Lipase: 41 U/L (ref 11–51)

## 2023-08-27 MED ORDER — POLYETHYLENE GLYCOL 3350 17 GM/SCOOP PO POWD: 17.0000 g | Freq: Every day | ORAL | 0 refills | Status: AC | PRN

## 2023-08-27 MED ORDER — ALUM & MAG HYDROXIDE-SIMETH 200-200-20 MG/5ML PO SUSP
30.0000 mL | Freq: Once | ORAL | Status: AC
  Administered 2023-08-27: 30 mL via ORAL
  Filled 2023-08-27: qty 30

## 2023-08-27 MED ORDER — DICYCLOMINE HCL 20 MG PO TABS: 20.0000 mg | ORAL_TABLET | Freq: Two times a day (BID) | ORAL | 0 refills | Status: AC

## 2023-08-27 MED ORDER — OXYCODONE-ACETAMINOPHEN 5-325 MG PO TABS: 1.0000 | ORAL_TABLET | Freq: Four times a day (QID) | ORAL | 0 refills | Status: AC | PRN

## 2023-08-27 MED ORDER — IOHEXOL 300 MG/ML  SOLN
100.0000 mL | Freq: Once | INTRAMUSCULAR | Status: AC | PRN
  Administered 2023-08-27: 100 mL via INTRAVENOUS

## 2023-08-27 NOTE — Discharge Instructions (Addendum)
 It was a pleasure caring for you today in the emergency department.  Please follow a clear liquid diet for the next week.  Please call gastroenterology to arrange follow-up in the next 6 weeks. If symptoms worsen or do not improve over the next few days please return for recheck.   Please return to the emergency department for any worsening or worrisome symptoms.

## 2023-08-27 NOTE — ED Triage Notes (Addendum)
 Pt reports abdominal pain x 3 days, but only with having BM . Blood with BM last night.Hx of diverticulitis. Pt reports eating popcorn at movie theater

## 2023-08-27 NOTE — ED Notes (Signed)
Patient transported to CT and back without event.

## 2023-08-27 NOTE — ED Provider Notes (Signed)
 [facility] Provider Note  CSN: [id number] Arrival date & time: 08/27/23 9184  Chief Complaint(s) Abdominal Pain  HPI Eric Frye is a 33 y.o. male with past medical history as below, significant for bipolar disorder, depression, diverticulitis who presents to the ED with complaint of abdominal pain, hematochezia  Patient reports chronic abdominal pain associate with his diverticulosis, has acutely worsened in the past 24 hours.  He had blood with bowel movement earlier today, pain is worsened with bowel movements.  Denies history of hemorrhoids.  Does have frequent bouts of constipation.  Occasional nausea but no vomiting.  Increased urinary frequency but no dysuria or urgency.  No fevers or chills.  No blood thinners, no hematuria.  Pain is suprapubic, left lower quadrant, cramping/sharp at times  Past Medical History Past Medical History:  Diagnosis Date   Bipolar disorder (HCC)    Depression    Diverticulitis    There are no active problems to display for this patient.  Home Medication(s) Prior to Admission medications   Medication Sig Start Date End Date Taking? Authorizing Provider  dicyclomine  (BENTYL ) 20 MG tablet Take 1 tablet (20 mg total) by mouth 2 (two) times daily. 08/27/23  Yes Elnor Jayson LABOR, DO  oxyCODONE -acetaminophen  (PERCOCET/ROXICET) 5-325 MG tablet Take 1 tablet by mouth every 6 (six) hours as needed for severe pain (pain score 7-10). 08/27/23  Yes Elnor Jayson A, DO  polyethylene glycol powder (GLYCOLAX /MIRALAX ) 17 GM/SCOOP powder Take 17 g by mouth daily as needed for mild constipation or moderate constipation. 08/27/23  Yes Elnor Jayson A, DO  busPIRone (BUSPAR) 10 MG tablet Take 10 mg by mouth 2 (two) times daily.    [provider]  cephALEXin  (KEFLEX ) 500 MG capsule Take 1 capsule (500 mg total) by mouth 4 (four) times daily. 06/11/13   Levander Houston, MD  Lurasidone HCl (LATUDA) 20 MG TABS Take 1 tablet by  mouth daily.    [provider]  ondansetron  (ZOFRAN -ODT) 4 MG disintegrating tablet Take 1 tablet (4 mg total) by mouth every 8 (eight) hours as needed for nausea or vomiting. 05/01/21   Schuyler Charlie RAMAN, MD  traZODone (DESYREL) 100 MG tablet Take 50-100 mg by mouth at bedtime.    [provider]                                                                                                                                    Past Surgical History History reviewed. No pertinent surgical history. Family History History reviewed. No pertinent family history.  Social History Social History   Tobacco Use   Smoking status: Every Day    Current packs/day: 1.00    Types: Cigarettes   Smokeless tobacco: Former  Substance Use Topics   Alcohol use: Yes    Comment: ocassionally   Drug use: Yes    Frequency: 12.0 times per week  Types: Marijuana   Allergies Patient has no known allergies.  Review of Systems A thorough review of systems was obtained and all systems are negative except as noted in the HPI and PMH.   Physical Exam Vital Signs  I have reviewed the triage vital signs BP 122/77   Pulse (!) 41   Temp 98.8 F (37.1 C)   Resp 18   Wt 95.3 kg   SpO2 100%   BMI 29.29 kg/m  Physical Exam Vitals and nursing note reviewed.  Constitutional:      General: He is not in acute distress.    Appearance: He is well-developed.  HENT:     Head: Normocephalic and atraumatic.     Right Ear: External ear normal.     Left Ear: External ear normal.     Mouth/Throat:     Mouth: Mucous membranes are moist.  Eyes:     General: No scleral icterus. Cardiovascular:     Rate and Rhythm: Normal rate and regular rhythm.     Pulses: Normal pulses.     Heart sounds: Normal heart sounds.  Pulmonary:     Effort: Pulmonary effort is normal. No respiratory distress.     Breath sounds: Normal breath sounds.  Abdominal:     General: Abdomen is flat.     Palpations: Abdomen  is soft.     Tenderness: There is abdominal tenderness in the suprapubic area and left lower quadrant.  Musculoskeletal:     Cervical back: No rigidity.     Right lower leg: No edema.     Left lower leg: No edema.  Skin:    General: Skin is warm and dry.     Capillary Refill: Capillary refill takes less than 2 seconds.  Neurological:     Mental Status: He is alert.  Psychiatric:        Mood and Affect: Mood normal.        Behavior: Behavior normal.     ED Results and Treatments Labs (all labs ordered are listed, but only abnormal results are displayed) Labs Reviewed  COMPREHENSIVE METABOLIC PANEL WITH GFR - Abnormal; Notable for the following components:      Result Value   Glucose, Bld 101 (*)    All other components within normal limits  URINALYSIS, ROUTINE W REFLEX MICROSCOPIC - Abnormal; Notable for the following components:   Hgb urine dipstick TRACE (*)    All other components within normal limits  URINALYSIS, MICROSCOPIC (REFLEX) - Abnormal; Notable for the following components:   Bacteria, UA RARE (*)    All other components within normal limits  LIPASE, BLOOD  CBC WITH DIFFERENTIAL/PLATELET                                                                                                                          Radiology CT ABDOMEN PELVIS W CONTRAST Result Date: 08/27/2023 CLINICAL DATA:  Left lower quadrant abdominal pain. EXAM: CT ABDOMEN AND PELVIS WITH CONTRAST  TECHNIQUE: Multidetector CT imaging of the abdomen and pelvis was performed using the standard protocol following bolus administration of intravenous contrast. RADIATION DOSE REDUCTION: This exam was performed according to the departmental dose-optimization program which includes automated exposure control, adjustment of the mA and/or kV according to patient size and/or use of iterative reconstruction technique. CONTRAST:  OMNIPAQUE  IOHEXOL  300 MG/ML  SOLN COMPARISON:  CT abdomen/pelvis dated 06/11/2013.  FINDINGS: Lower chest: Mild dependent changes at the left posterior lung base. Hepatobiliary: No focal liver abnormality is seen. No gallstones, gallbladder wall thickening, or biliary dilatation. Pancreas: Unremarkable. No pancreatic ductal dilatation or surrounding inflammatory changes. Spleen: Normal in size without focal abnormality. Adrenals/Urinary Tract: Adrenal glands are unremarkable. Kidneys are normal, without renal calculi, suspicious focal lesion, or hydronephrosis. Bladder is unremarkable. Stomach/Bowel: Stomach is within normal limits. Appendix appears normal. No evidence of bowel wall thickening, distention, or inflammatory changes. Sigmoid colonic diverticulosis without evidence of acute diverticulitis. Vascular/Lymphatic: Abdominal aorta is normal in caliber. No enlarged abdominal or pelvic lymph nodes. Reproductive: Prostate is unremarkable. Other: No abdominal wall hernia or abnormality. No abdominopelvic ascites. No intraperitoneal free air. Musculoskeletal: No acute or significant osseous findings. IMPRESSION: 1. No acute localizing findings in the abdomen or pelvis. 2. Sigmoid colonic diverticulosis without evidence of acute diverticulitis. Electronically Signed   By: Harrietta Sherry M.D.   On: 08/27/2023 11:38    Pertinent labs & imaging results that were available during my care of the patient were reviewed by me and considered in my medical decision making (see MDM for details).  Medications Ordered in ED Medications  alum & mag hydroxide-simeth (MAALOX/MYLANTA) 200-200-20 MG/5ML suspension 30 mL (30 mLs Oral Given 08/27/23 0859)  iohexol  (OMNIPAQUE ) 300 MG/ML solution 100 mL (100 mLs Intravenous Contrast Given 08/27/23 1021)                                                                                                                                     Procedures Procedures  (including critical care time)  Medical Decision Making / ED Course    Medical Decision Making:     Loel Betancur is a 33 y.o. male with past medical history as below, significant for bipolar disorder, depression, diverticulitis who presents to the ED with complaint of abdominal pain, hematochezia. The complaint involves an extensive differential diagnosis and also carries with it a high risk of complications and morbidity.  Serious etiology was considered. Ddx includes but is not limited to: Differential diagnosis includes but is not exclusive to acute appendicitis, renal colic, testicular torsion, urinary tract infection, prostatitis,  diverticulitis, small bowel obstruction, colitis, abdominal aortic aneurysm, gastroenteritis, constipation etc.   Complete initial physical exam performed, notably the patient was in no acute distress, resting comfortably.    Reviewed and confirmed nursing documentation for past medical history, family history, social history.  Vital signs reviewed.    Left lower quadrant abd pain Hematochezia > -  History of diverticulosis/diverticulitis.  Left lower quadrant pain, not peritoneal. - labs: Stable - CTAP -diverticulosis - Patient is feeling much better on recheck, he is tolerant p.o. intake without difficulty, abdomen is soft, nonperitoneal.  He is HDS.  Patient has diverticulosis on imaging, no evidence of diverticulitis at this time.  Does have some scant rectal bleeding with bowel movements.  No significant pain with defecation or pain with wiping.  Encourage patient to clear liquid diet for the next week, given laxative to help with occasional constipation and Bentyl .  Advised follow-up with GI for further evaluation        Patient in no distress and overall condition is stable. Detailed discussions were had with the patient/guardian regarding current findings, and need for close f/u with PCP or on call doctor. The patient/guardian has been instructed to return immediately if the symptoms worsen in any way for re-evaluation. Patient/guardian verbalized  understanding and is in agreement with current care plan. All questions answered prior to discharge.               Additional history obtained: -Additional history obtained from na -External records from outside source obtained and reviewed including: Chart review including previous notes, labs, imaging, consultation notes including  Prior er evaluation Pdmp    Lab Tests: -I ordered, reviewed, and interpreted labs.   The pertinent results include:   Labs Reviewed  COMPREHENSIVE METABOLIC PANEL WITH GFR - Abnormal; Notable for the following components:      Result Value   Glucose, Bld 101 (*)    All other components within normal limits  URINALYSIS, ROUTINE W REFLEX MICROSCOPIC - Abnormal; Notable for the following components:   Hgb urine dipstick TRACE (*)    All other components within normal limits  URINALYSIS, MICROSCOPIC (REFLEX) - Abnormal; Notable for the following components:   Bacteria, UA RARE (*)    All other components within normal limits  LIPASE, BLOOD  CBC WITH DIFFERENTIAL/PLATELET    Notable for labs stable  EKG   EKG Interpretation Date/Time:    Ventricular Rate:    PR Interval:    QRS Duration:    QT Interval:    QTC Calculation:   R Axis:      Text Interpretation:           Imaging Studies ordered: I ordered imaging studies including CT AP I independently visualized the following imaging with scope of interpretation limited to determining acute life threatening conditions related to emergency care; findings noted above I agree with the radiologist interpretation If any imaging was obtained with contrast I closely monitored patient for any possible adverse reaction a/w contrast administration in the emergency department   Medicines ordered and prescription drug management: Meds ordered this encounter  Medications   alum & mag hydroxide-simeth (MAALOX/MYLANTA) 200-200-20 MG/5ML suspension 30 mL   iohexol  (OMNIPAQUE ) 300 MG/ML  solution 100 mL   dicyclomine  (BENTYL ) 20 MG tablet    Sig: Take 1 tablet (20 mg total) by mouth 2 (two) times daily.    Dispense:  20 tablet    Refill:  0   oxyCODONE -acetaminophen  (PERCOCET/ROXICET) 5-325 MG tablet    Sig: Take 1 tablet by mouth every 6 (six) hours as needed for severe pain (pain score 7-10).    Dispense:  10 tablet    Refill:  0   polyethylene glycol powder (GLYCOLAX /MIRALAX ) 17 GM/SCOOP powder    Sig: Take 17 g by mouth daily as needed for mild constipation or moderate constipation.  Dispense:  238 g    Refill:  0    -I have reviewed the patients home medicines and have made adjustments as needed   Consultations Obtained: na   Cardiac Monitoring: Continuous pulse oximetry interpreted by myself, 100% on RA.    Social Determinants of Health:  Diagnosis or treatment significantly limited by social determinants of health: na   Reevaluation: After the interventions noted above, I reevaluated the patient and found that they have improved  Co morbidities that complicate the patient evaluation  Past Medical History:  Diagnosis Date   Bipolar disorder (HCC)    Depression    Diverticulitis       Dispostion: Disposition decision including need for hospitalization was considered, and patient discharged from emergency department.    Final Clinical Impression(s) / ED Diagnoses Final diagnoses:  Diverticulosis  Abdominal pain, unspecified abdominal location        Elnor Jayson LABOR, DO 08/27/23 1209
